# Patient Record
Sex: Male | Born: 2000 | Race: Black or African American | Hispanic: No | Marital: Single | Smoking: Current every day smoker
Health system: Southern US, Community
[De-identification: ages and names within clinical notes are randomized; demographics above are authoritative.]

---

## 2019-04-18 ENCOUNTER — Emergency Department (HOSPITAL_COMMUNITY)
Admission: EM | Admit: 2019-04-18 | Discharge: 2019-04-19 | Disposition: A | Discharge status: Home / Self Care | Payer: Self-pay | Attending: Emergency Medicine | Admitting: Emergency Medicine

## 2019-04-18 ENCOUNTER — Emergency Department (HOSPITAL_COMMUNITY): Payer: Self-pay

## 2019-04-18 DIAGNOSIS — Y939 Activity, unspecified: Secondary | ICD-10-CM | POA: Insufficient documentation

## 2019-04-18 DIAGNOSIS — Y92009 Unspecified place in unspecified non-institutional (private) residence as the place of occurrence of the external cause: Secondary | ICD-10-CM | POA: Insufficient documentation

## 2019-04-18 DIAGNOSIS — S21211A Laceration without foreign body of right back wall of thorax without penetration into thoracic cavity, initial encounter: Secondary | ICD-10-CM | POA: Insufficient documentation

## 2019-04-18 DIAGNOSIS — Y999 Unspecified external cause status: Secondary | ICD-10-CM | POA: Insufficient documentation

## 2019-04-18 LAB — BASIC METABOLIC PANEL
Anion gap: 10 (ref 5–15)
BUN: 20 mg/dL (ref 6–20)
CO2: 25 mmol/L (ref 22–32)
Calcium: 9 mg/dL (ref 8.9–10.3)
Chloride: 103 mmol/L (ref 98–111)
Creatinine, Ser: 0.98 mg/dL (ref 0.61–1.24)
GFR calc Af Amer: >60 mL/min (ref >60)
GFR calc non Af Amer: >60 mL/min (ref >60)
Glucose, Bld: 97 mg/dL (ref 70–99)
Potassium: 3.9 mmol/L (ref 3.5–5.1)
Sodium: 138 mmol/L (ref 135–145)

## 2019-04-18 LAB — CBC WITH DIFFERENTIAL/PLATELET
Abs Immature Granulocytes: 0.03 10*3/uL (ref 0.00–0.07)
Basophils Absolute: 0 10*3/uL (ref 0.0–0.1)
Basophils Relative: 0 %
Eosinophils Absolute: 0.1 10*3/uL (ref 0.0–0.5)
Eosinophils Relative: 1 %
HCT: 42.7 % (ref 39.0–52.0)
Hemoglobin: 12.6 g/dL — ABNORMAL LOW (ref 13.0–17.0)
Immature Granulocytes: 0 %
Lymphocytes Relative: 49 %
Lymphs Abs: 4 10*3/uL (ref 0.7–4.0)
MCH: 24.5 pg — ABNORMAL LOW (ref 26.0–34.0)
MCHC: 29.5 g/dL — ABNORMAL LOW (ref 30.0–36.0)
MCV: 83.1 fL (ref 80.0–100.0)
Monocytes Absolute: 0.4 10*3/uL (ref 0.1–1.0)
Monocytes Relative: 5 %
Neutro Abs: 3.7 10*3/uL (ref 1.7–7.7)
Neutrophils Relative %: 45 %
Platelets: 199 10*3/uL (ref 150–400)
RBC: 5.14 MIL/uL (ref 4.22–5.81)
RDW: 13.8 % (ref 11.5–15.5)
WBC: 8.3 10*3/uL (ref 4.0–10.5)
nRBC: 0 % (ref 0.0–0.2)

## 2019-04-18 LAB — I-STAT CHEM 8, ED
BUN: 27 mg/dL — ABNORMAL HIGH (ref 6–20)
Calcium, Ion: 1.17 mmol/L (ref 1.15–1.40)
Chloride: 101 mmol/L (ref 98–111)
Creatinine, Ser: 1 mg/dL (ref 0.61–1.24)
Glucose, Bld: 93 mg/dL (ref 70–99)
HCT: 42 % (ref 39.0–52.0)
Hemoglobin: 14.3 g/dL (ref 13.0–17.0)
Potassium: 3.9 mmol/L (ref 3.5–5.1)
Sodium: 140 mmol/L (ref 135–145)
TCO2: 33 mmol/L — ABNORMAL HIGH (ref 22–32)

## 2019-04-18 MED ORDER — IOHEXOL 300 MG/ML  SOLN
100.0000 mL | Freq: Once | INTRAMUSCULAR | Status: AC | PRN
  Administered 2019-04-18: 100 mL via INTRAVENOUS
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
## 2019-04-19 ENCOUNTER — Other Ambulatory Visit: Payer: Self-pay

## 2019-04-19 ENCOUNTER — Encounter (HOSPITAL_COMMUNITY): Payer: Self-pay
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
